# Patient Record
Sex: Female | Born: 1995 | Race: Black or African American | Hispanic: No | Marital: Single | Smoking: Never smoker
Health system: Southern US, Community
[De-identification: ages and names within clinical notes are randomized; demographics above are authoritative.]

## PROBLEM LIST (undated history)

## (undated) DIAGNOSIS — I10 Essential (primary) hypertension: Secondary | ICD-10-CM

## (undated) HISTORY — DX: Essential (primary) hypertension: I10

## (undated) HISTORY — PX: NO PAST SURGERIES: SHX2092

---
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
## 2014-01-18 ENCOUNTER — Encounter (HOSPITAL_COMMUNITY): Payer: Self-pay | Admitting: Emergency Medicine

## 2014-01-18 DIAGNOSIS — IMO0002 Reserved for concepts with insufficient information to code with codable children: Secondary | ICD-10-CM | POA: Insufficient documentation

## 2014-01-18 DIAGNOSIS — Y939 Activity, unspecified: Secondary | ICD-10-CM | POA: Insufficient documentation

## 2014-01-18 DIAGNOSIS — W19XXXA Unspecified fall, initial encounter: Secondary | ICD-10-CM | POA: Insufficient documentation

## 2014-01-18 DIAGNOSIS — Y929 Unspecified place or not applicable: Secondary | ICD-10-CM | POA: Insufficient documentation

## 2014-01-18 NOTE — ED Notes (Signed)
Patient states she fell yesterday injuring her left middle finger.

## 2014-01-19 ENCOUNTER — Emergency Department (HOSPITAL_COMMUNITY)
Admission: EM | Admit: 2014-01-19 | Discharge: 2014-01-19 | Disposition: A | Discharge status: Home / Self Care | Payer: Medicaid Other | Attending: Emergency Medicine | Admitting: Emergency Medicine

## 2014-01-19 ENCOUNTER — Emergency Department (HOSPITAL_COMMUNITY): Payer: Medicaid Other

## 2014-01-19 DIAGNOSIS — S62603A Fracture of unspecified phalanx of left middle finger, initial encounter for closed fracture: Secondary | ICD-10-CM

## 2014-01-19 NOTE — ED Provider Notes (Signed)
Medical screening examination/treatment/procedure(s) were performed by non-physician practitioner and as supervising physician I was immediately available for consultation/collaboration.   Trevel Dillenbeck, MD 01/19/14 0718 

## 2014-01-19 NOTE — ED Notes (Signed)
Patient withcont. pain at this time. Respirations even and unlabored. Skin warm/dry. Discharge instructions reviewed with patient at this time. Patient given opportunity to voice concerns/ask questions.. Patient discharged at this time and left Emergency Department with steady gait.   

## 2014-01-19 NOTE — Discharge Instructions (Signed)
Finger Fracture Fractures of fingers are breaks in the bones of the fingers. There are many types of fractures. There are different ways of treating these fractures. Your health care provider will discuss the best way to treat your fracture. CAUSES Traumatic injury is the main cause of broken fingers. These include:  Injuries while playing sports.  Workplace injuries.  Falls. RISK FACTORS Activities that can increase your risk of finger fractures include:  Sports.  Workplace activities that involve machinery.  A condition called osteoporosis, which can make your bones less dense and cause them to fracture more easily. SIGNS AND SYMPTOMS The main symptoms of a broken finger are pain and swelling within 15 minutes after the injury. Other symptoms include:  Bruising of your finger.  Stiffness of your finger.  Numbness of your finger.  Exposed bones (compound fracture) if the fracture is severe. DIAGNOSIS  The best way to diagnose a broken bone is with X-ray imaging. Additionally, your health care provider will use this X-ray image to evaluate the position of the broken finger bones.  TREATMENT  Finger fractures can be treated with:   Nonreduction This means the bones are in place. The finger is splinted without changing the positions of the bone pieces. The splint is usually left on for about a week to 10 days. This will depend on your fracture and what your health care provider thinks.  Closed reduction The bones are put back into position without using surgery. The finger is then splinted.  Open reduction and internal fixation The fracture site is opened. Then the bone pieces are fixed into place with pins or some type of hardware. This is seldom required. It depends on the severity of the fracture. HOME CARE INSTRUCTIONS   Follow your health care provider's instructions regarding activities, exercises, and physical therapy.  Only take over-the-counter or prescription  medicines for pain, discomfort, or fever as directed by your health care provider. SEEK MEDICAL CARE IF: You have pain or swelling that limits the motion or use of your fingers. SEEK IMMEDIATE MEDICAL CARE IF:  Your finger becomes numb. MAKE SURE YOU:   Understand these instructions.  Will watch your condition.  Will get help right away if you are not doing well or get worse. Document Released: 03/25/2001 Document Revised: 10/01/2013 Document Reviewed: 07/23/2013 [facility] Patient Information 2014 [place], [place].  IBUPROFEN OR TYLENOL FOR PAIN.  WEAR SPLINT AS DIRECTED. FOLLOW-UP WITH THE ORTHOPEDIST.

## 2014-01-19 NOTE — ED Provider Notes (Signed)
CSN: [id number]     Arrival date & time 01/18/14  2303 History   First MD Initiated Contact with Patient 01/19/14 0024     Chief Complaint  Patient presents with  . Finger Injury   (Consider location/radiation/quality/duration/timing/severity/associated sxs/prior Treatment) HPI Comments: Pain and swelling of middle finger left hand.  Patient is right hand dominant.  Patient is a 18 y.o. female presenting with hand pain.  Hand Pain This is a new problem. The current episode started yesterday. The problem has been waxing and waning. Associated symptoms include arthralgias. The symptoms are aggravated by bending. She has tried nothing for the symptoms.    History reviewed. No pertinent past medical history. History reviewed. No pertinent past surgical history. History reviewed. No pertinent family history. History  Substance Use Topics  . Smoking status: Never Smoker   . Smokeless tobacco: Not on file  . Alcohol Use: No   OB History   Grav Para Term Preterm Abortions TAB SAB Ect Mult Living                 Review of Systems  Musculoskeletal: Positive for arthralgias.  All other systems reviewed and are negative.    Allergies  Review of patient's allergies indicates no known allergies.  Home Medications  No current outpatient prescriptions on file. BP 122/77  Pulse 83  Temp(Src) 98.1 F (36.7 C) (Oral)  Resp 24  Ht 5\' 3"  (1.6 m)  Wt 130 lb (58.968 kg)  BMI 23.03 kg/m2  SpO2 100%  LMP 01/14/2014 Physical Exam  Nursing note and vitals reviewed. Constitutional: She is oriented to person, place, and time. She appears well-developed and well-nourished.  HENT:  Head: Normocephalic.  Eyes: Conjunctivae are normal. Pupils are equal, round, and reactive to light.  Neck: Normal range of motion.  Cardiovascular: Normal rate and regular rhythm.   Pulmonary/Chest: Effort normal and breath sounds normal.  Abdominal: Soft. Bowel sounds are normal.  Musculoskeletal: She  exhibits tenderness.  Left middle finger discolored from middle to distal phalanx with mild edema.  Able to flex, limited extension.  Brisk capillary refill, sensation intact.  Lymphadenopathy:    She has no cervical adenopathy.  Neurological: She is alert and oriented to person, place, and time.  Skin: Skin is warm and dry.  Psychiatric: She has a normal mood and affect.    ED Course  Procedures (including critical care time) Labs Review Labs Reviewed - No data to display Imaging Review No results found.  EKG Interpretation   None      Radiology results reviewed and shared with patient. MDM  Fracture of distal aspect of middle phalanx of middle finger left hand.  Static splint, orthopedic follow-up.    Jimmye Normanavid John Tysha Grismore, NP 01/19/14 [phone]

## 2014-06-26 IMAGING — CR DG FINGER MIDDLE 2+V*L*
1 series · 1 of 1 positions shown · non-contrast
Comparison: None.

CLINICAL DATA: Fall, pain, injury

EXAM:
LEFT MIDDLE FINGER 2+V

[view not recorded]
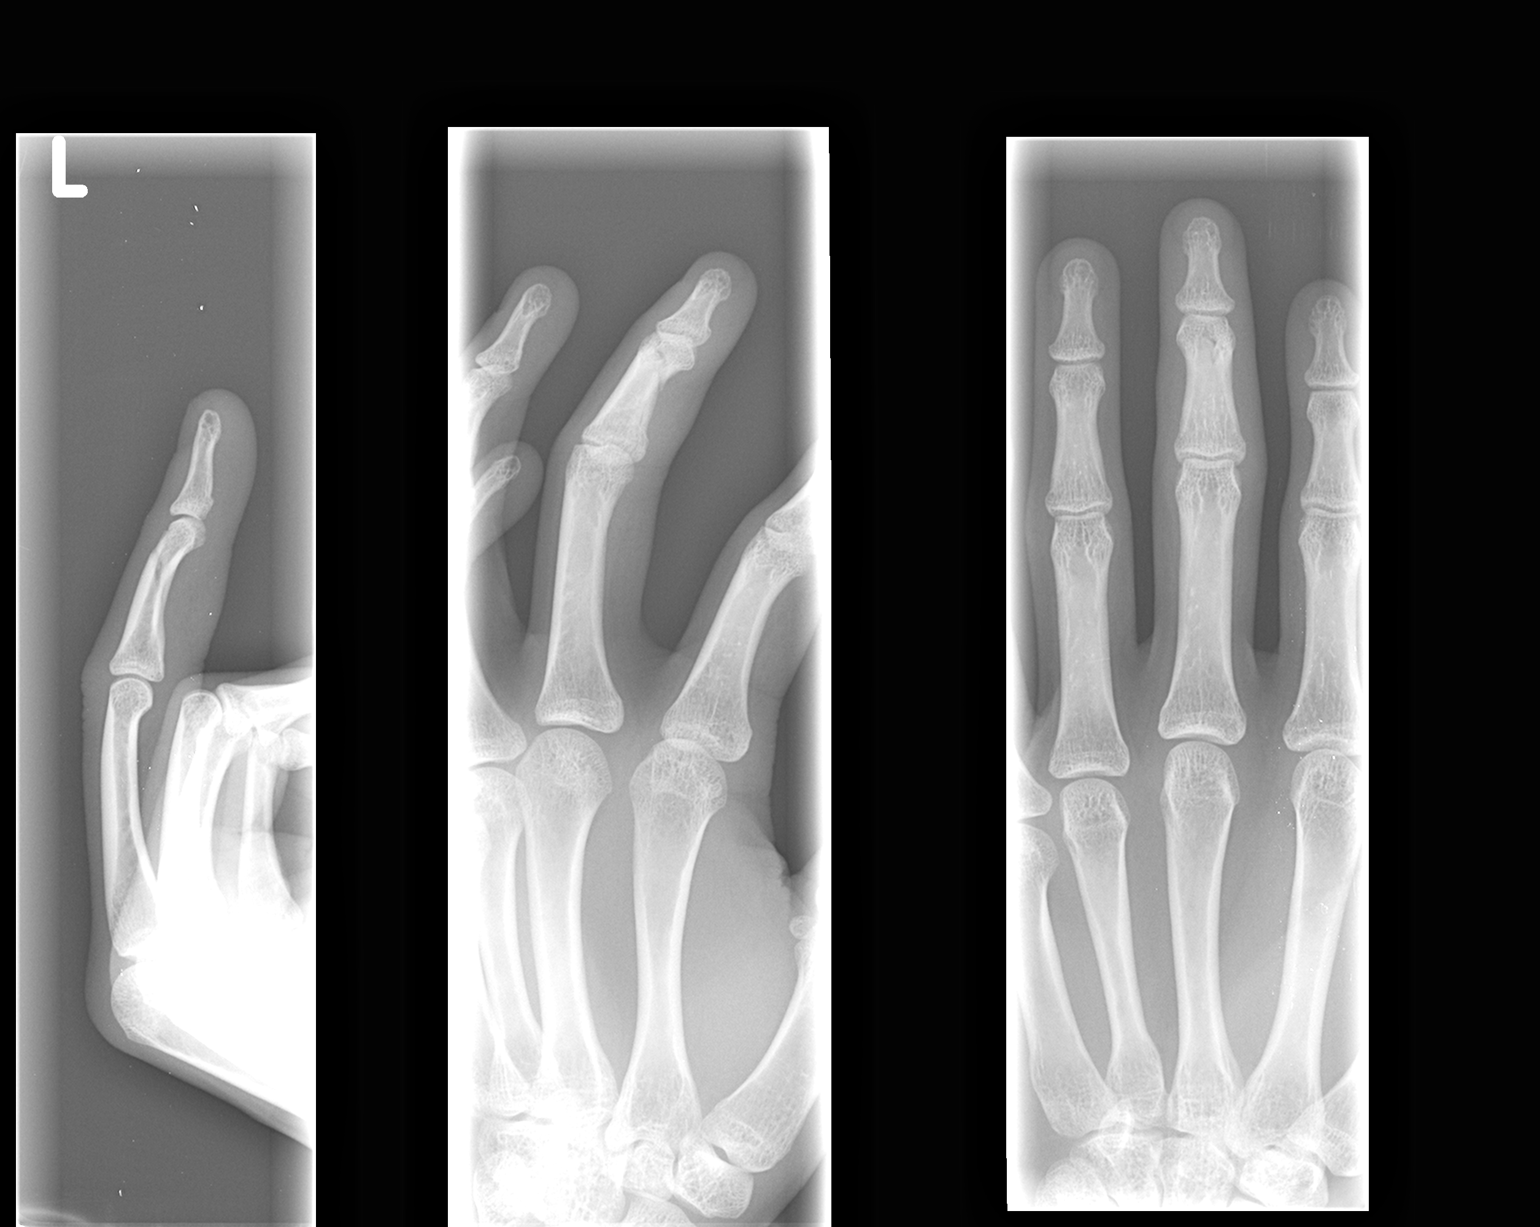

[1 of 1 positions shown; findings below may reference images not displayed]

FINDINGS: There is an acute oblique fracture of the third finger middle
phalanx with slight volar tilt. Mild soft tissue swelling. No other
fractures. Preserved joint spaces. No subluxation or dislocation.
IMPRESSION: Acute fracture left third finger middle phalanx

## 2016-06-01 DIAGNOSIS — O1490 Unspecified pre-eclampsia, unspecified trimester: Secondary | ICD-10-CM

## 2018-07-25 ENCOUNTER — Encounter (HOSPITAL_COMMUNITY): Payer: Self-pay | Admitting: Emergency Medicine

## 2018-07-25 ENCOUNTER — Other Ambulatory Visit: Payer: Self-pay

## 2018-07-25 ENCOUNTER — Emergency Department (HOSPITAL_COMMUNITY)
Admission: EM | Admit: 2018-07-25 | Discharge: 2018-07-25 | Disposition: A | Discharge status: Home / Self Care | Payer: Self-pay | Attending: Emergency Medicine | Admitting: Emergency Medicine

## 2018-07-25 DIAGNOSIS — J029 Acute pharyngitis, unspecified: Secondary | ICD-10-CM

## 2018-07-25 DIAGNOSIS — Y828 Other medical devices associated with adverse incidents: Secondary | ICD-10-CM | POA: Insufficient documentation

## 2018-07-25 DIAGNOSIS — Z79899 Other long term (current) drug therapy: Secondary | ICD-10-CM | POA: Insufficient documentation

## 2018-07-25 DIAGNOSIS — T8332XA Displacement of intrauterine contraceptive device, initial encounter: Secondary | ICD-10-CM

## 2018-07-25 DIAGNOSIS — N939 Abnormal uterine and vaginal bleeding, unspecified: Secondary | ICD-10-CM

## 2018-07-25 DIAGNOSIS — A599 Trichomoniasis, unspecified: Secondary | ICD-10-CM

## 2018-07-25 LAB — BASIC METABOLIC PANEL
ANION GAP: 8 (ref 5–15)
BUN: 11 mg/dL (ref 6–20)
CHLORIDE: 103 mmol/L (ref 98–111)
CO2: 24 mmol/L (ref 22–32)
Calcium: 8.5 mg/dL — ABNORMAL LOW (ref 8.9–10.3)
Creatinine, Ser: 1.04 mg/dL — ABNORMAL HIGH (ref 0.44–1.00)
GFR calc Af Amer: >60 mL/min (ref >60)
GLUCOSE: 106 mg/dL — AB (ref 70–99)
POTASSIUM: 3.2 mmol/L — AB (ref 3.5–5.1)
Sodium: 135 mmol/L (ref 135–145)

## 2018-07-25 LAB — WET PREP, GENITAL
CLUE CELLS WET PREP: NONE SEEN
SPERM: NONE SEEN

## 2018-07-25 LAB — CBC WITH DIFFERENTIAL/PLATELET
Basophils Absolute: 0 10*3/uL (ref 0.0–0.1)
Basophils Relative: 0 %
EOS PCT: 0 %
Eosinophils Absolute: 0 10*3/uL (ref 0.0–0.7)
HEMATOCRIT: 40.7 % (ref 36.0–46.0)
Hemoglobin: 13.9 g/dL (ref 12.0–15.0)
LYMPHS PCT: 14 %
Lymphs Abs: 1.5 10*3/uL (ref 0.7–4.0)
MCH: 29.5 pg (ref 26.0–34.0)
MCHC: 34.2 g/dL (ref 30.0–36.0)
MCV: 86.4 fL (ref 78.0–100.0)
Monocytes Absolute: 1.2 10*3/uL — ABNORMAL HIGH (ref 0.1–1.0)
Monocytes Relative: 11 %
NEUTROS ABS: 8.2 10*3/uL — AB (ref 1.7–7.7)
Neutrophils Relative %: 75 %
PLATELETS: 194 10*3/uL (ref 150–400)
RBC: 4.71 MIL/uL (ref 3.87–5.11)
RDW: 13.6 % (ref 11.5–15.5)
WBC: 10.9 10*3/uL — AB (ref 4.0–10.5)

## 2018-07-25 LAB — GROUP A STREP BY PCR: Group A Strep by PCR: NOT DETECTED

## 2018-07-25 LAB — PREGNANCY, URINE: Preg Test, Ur: NEGATIVE

## 2018-07-25 MED ORDER — METRONIDAZOLE 500 MG PO TABS: 500.0000 mg | ORAL_TABLET | Freq: Two times a day (BID) | ORAL | 0 refills | Status: DC

## 2018-07-25 MED ORDER — ACETAMINOPHEN 325 MG PO TABS
650.0000 mg | ORAL_TABLET | Freq: Once | ORAL | Status: AC
  Administered 2018-07-25: 650 mg via ORAL
  Filled 2018-07-25: qty 2

## 2018-07-25 MED ORDER — LIDOCAINE HCL (PF) 1 % IJ SOLN
INTRAMUSCULAR | Status: AC
  Administered 2018-07-25: 21:00:00
  Filled 2018-07-25: qty 5

## 2018-07-25 MED ORDER — AZITHROMYCIN 250 MG PO TABS
1000.0000 mg | ORAL_TABLET | Freq: Once | ORAL | Status: AC
  Administered 2018-07-25: 1000 mg via ORAL
  Filled 2018-07-25: qty 4

## 2018-07-25 MED ORDER — FLUCONAZOLE 100 MG PO TABS
100.0000 mg | ORAL_TABLET | Freq: Once | ORAL | Status: AC
  Administered 2018-07-25: 100 mg via ORAL
  Filled 2018-07-25: qty 1

## 2018-07-25 MED ORDER — CEFTRIAXONE SODIUM 250 MG IJ SOLR
250.0000 mg | Freq: Once | INTRAMUSCULAR | Status: AC
  Administered 2018-07-25: 250 mg via INTRAMUSCULAR
  Filled 2018-07-25: qty 250

## 2018-07-25 MED ORDER — NORGESTIMATE-ETH ESTRADIOL 0.25-35 MG-MCG PO TABS: 1.0000 | ORAL_TABLET | Freq: Every day | ORAL | 0 refills | Status: DC

## 2018-07-25 NOTE — ED Triage Notes (Signed)
Pt received Mirana IUD 7 weeks ago and c/o vag bleeding since. Did let OB know. Pt c/o sore throat, not eating, fever and gen weakenss x 2 days.

## 2018-07-25 NOTE — Discharge Instructions (Signed)
We have removed your IUD. You will need to start the birth control pills and follow up with your GYN to discuss further birth control. We are treating you for trichomonas. Do not drink alcohol while taking the medication because it will make you sick if you do. Be sure your sex partner is treated for trichomonas before you have sex again.

## 2018-07-25 NOTE — ED Provider Notes (Signed)
[facility] EMERGENCY DEPARTMENT Provider Note   CSN: [id number] Arrival date & time: 07/25/18  1632     History   Chief Complaint Chief Complaint  Patient presents with  . Vaginal Bleeding    HPI Rhonda Leach is a 22 y.o. female who presents to the ED with c/o vaginal bleeding. Patient reports having IUD placed 2 years ago and has had bleeding off and on.  Patient has discussed this with her OB. Patient also c/o sore throat, fever and feeling tired x 2 days.   HPI  History reviewed. No pertinent past medical history.  There are no active problems to display for this patient.   History reviewed. No pertinent surgical history.   OB History   None      Home Medications    Prior to Admission medications   Medication Sig Start Date End Date Taking? Authorizing Provider  metroNIDAZOLE (FLAGYL) 500 MG tablet Take 1 tablet (500 mg total) by mouth 2 (two) times daily. 07/25/18   Janne NapoleonNeese, Hope M, NP  norgestimate-ethinyl estradiol (SPRINTEC 28) 0.25-35 MG-MCG tablet Take 1 tablet by mouth daily. 07/25/18   Janne NapoleonNeese, Hope M, NP    Family History History reviewed. No pertinent family history.  Social History Social History   Tobacco Use  . Smoking status: Never Smoker  . Smokeless tobacco: Never Used  Substance Use Topics  . Alcohol use: No  . Drug use: No     Allergies   Patient has no known allergies.   Review of Systems Review of Systems  Constitutional: Positive for chills and fever.  HENT: Positive for sore throat.   Eyes: Negative for visual disturbance.  Respiratory: Negative for cough and shortness of breath.   Cardiovascular: Negative for chest pain.  Gastrointestinal: Negative for nausea and vomiting.  Genitourinary: Positive for vaginal bleeding.  Musculoskeletal: Negative for neck pain and neck stiffness.  Skin: Negative for rash.  Neurological: Positive for headaches. Negative for syncope.  Psychiatric/Behavioral: Negative for confusion.      Physical Exam Updated Vital Signs BP (!) 127/92 (BP Location: Right Arm)   Pulse 86   Temp 98.7 F (37.1 C) (Oral)   Resp 20   LMP 07/25/2018   SpO2 100%   Physical Exam  Constitutional: She appears well-developed and well-nourished. No distress.  Eyes: EOM are normal.  Neck: Neck supple.  Cardiovascular: Tachycardia present.  Pulmonary/Chest: Effort normal.  Abdominal: Soft. There is no tenderness.  Genitourinary:  Genitourinary Comments: External genitalia without lesions, small blood vaginal vault. Arm of IUD visualized outside the cervix, cervix inflamed. No CMT, no adnexal tenderness. Uterus without enlargement.   Musculoskeletal: Normal range of motion.  Neurological: She is alert.  Skin: Skin is warm and dry.  Psychiatric: She has a normal mood and affect.  Nursing note and vitals reviewed.    ED Treatments / Results  Labs (all labs ordered are listed, but only abnormal results are displayed) Labs Reviewed  WET PREP, GENITAL - Abnormal; Notable for the following components:      Result Value   Yeast Wet Prep HPF POC PRESENT (*)    Trich, Wet Prep PRESENT (*)    WBC, Wet Prep HPF POC MANY (*)    All other components within normal limits  CBC WITH DIFFERENTIAL/PLATELET - Abnormal; Notable for the following components:   WBC 10.9 (*)    Neutro Abs 8.2 (*)    Monocytes Absolute 1.2 (*)    All other components within normal limits  BASIC METABOLIC PANEL - Abnormal; Notable for the following components:   Potassium 3.2 (*)    Glucose, Bld 106 (*)    Creatinine, Ser 1.04 (*)    Calcium 8.5 (*)    All other components within normal limits  GROUP A STREP BY PCR  PREGNANCY, URINE  GC/CHLAMYDIA PROBE AMP ([place]) NOT AT [facility]   Radiology No results found.  Procedures .Foreign Body Removal Date/Time: 07/25/2018 8:44 PM Performed by: Janne Napoleon, NP Authorized by: Janne Napoleon, NP  Consent: Verbal consent obtained. Risks and benefits: risks,  benefits and alternatives were discussed Consent given by: patient Patient understanding: patient states understanding of the procedure being performed Required items: required blood products, implants, devices, and special equipment available Patient identity confirmed: verbally with patient Body area: vagina  Sedation: Patient sedated: no  Patient restrained: no Patient cooperative: yes Localization method: speculum Removal mechanism: forceps 1 objects recovered. Objects recovered: IUD Patient tolerance: Patient tolerated the procedure well with no immediate complications Comments: Patient with IUD partially expelled from cervix. Successfully removed the IUD.    (including critical care time)  Medications Ordered in ED Medications  acetaminophen (TYLENOL) tablet 650 mg (650 mg Oral Given 07/25/18 1730)  cefTRIAXone (ROCEPHIN) injection 250 mg (250 mg Intramuscular Given 07/25/18 1927)  azithromycin (ZITHROMAX) tablet 1,000 mg (1,000 mg Oral Given 07/25/18 1926)  lidocaine (PF) (XYLOCAINE) 1 % injection (  Given 07/25/18 2032)  fluconazole (DIFLUCAN) tablet 100 mg (100 mg Oral Given 07/25/18 2047)     Initial Impression / Assessment and Plan / ED Course  I have reviewed the triage vital signs and the nursing notes. Pt febrile without tonsillar exudate, negative strep. Presents with mild cervical lymphadenopathy, & dysphagia; diagnosis of viral pharyngitis.n  Pt does not appear dehydrated, but did discuss importance of water rehydration. Presentation non concerning for PTA or RPA. No trismus or uvula deviation. Specific return precautions discussed. Pt able to drink water in ED without difficulty with intact air way. Recommended PCP follow up. Patient also with abnormal vaginal bleeding and IUD that was partially expelled. Discussed with the patient after IUD removal need for f/u. Will start OC's tonight and will treat for positive trichomonas and yeast. Patient also covered for GC and  Chlamydia. Cultures pending.  Discussed no alcohol while taking the Flagyl. Patient agrees with plan.   Final Clinical Impressions(s) / ED Diagnoses   Final diagnoses:  Trichomonas infection  Abnormal vaginal bleeding  Displacement of intrauterine contraceptive device, initial encounter  Viral pharyngitis    ED Discharge Orders        Ordered    metroNIDAZOLE (FLAGYL) 500 MG tablet  2 times daily     07/25/18 2037    norgestimate-ethinyl estradiol (SPRINTEC 28) 0.25-35 MG-MCG tablet  Daily     07/25/18 2037       Kerrie [place], NP 07/25/18 2050    Dione Booze, MD 07/26/18 [phone]

## 2018-07-26 LAB — GC/CHLAMYDIA PROBE AMP (~~LOC~~) NOT AT ARMC
CHLAMYDIA, DNA PROBE: NEGATIVE
Neisseria Gonorrhea: NEGATIVE

## 2019-05-02 DIAGNOSIS — O1400 Mild to moderate pre-eclampsia, unspecified trimester: Secondary | ICD-10-CM

## 2021-03-14 ENCOUNTER — Other Ambulatory Visit: Payer: Self-pay | Admitting: Obstetrics & Gynecology

## 2021-03-14 DIAGNOSIS — Z363 Encounter for antenatal screening for malformations: Secondary | ICD-10-CM

## 2021-03-15 ENCOUNTER — Other Ambulatory Visit: Payer: Self-pay

## 2021-03-15 ENCOUNTER — Ambulatory Visit (INDEPENDENT_AMBULATORY_CARE_PROVIDER_SITE_OTHER): Payer: Medicaid Other

## 2021-03-15 DIAGNOSIS — Z363 Encounter for antenatal screening for malformations: Secondary | ICD-10-CM

## 2021-03-15 DIAGNOSIS — Z3A21 21 weeks gestation of pregnancy: Secondary | ICD-10-CM | POA: Diagnosis not present

## 2021-03-15 NOTE — Progress Notes (Addendum)
[place] 21+4 wks,cephalic,cx 3 cm,posterior placenta gr 0,fhr 154 bpm,AFI 7.4 cm,small stomach bubble,EFW 492 g 79%,limited view because of decreased AFI,limited view of CI and NB

## 2021-03-17 ENCOUNTER — Encounter: Payer: Self-pay | Admitting: Advanced Practice Midwife

## 2021-03-17 ENCOUNTER — Other Ambulatory Visit: Payer: Self-pay

## 2021-03-17 ENCOUNTER — Ambulatory Visit: Payer: Medicaid Other | Admitting: *Deleted

## 2021-03-17 ENCOUNTER — Ambulatory Visit (INDEPENDENT_AMBULATORY_CARE_PROVIDER_SITE_OTHER): Payer: Medicaid Other | Admitting: Advanced Practice Midwife

## 2021-03-17 VITALS — BP 120/80 | HR 101 | Wt 135.0 lb

## 2021-03-17 DIAGNOSIS — O09299 Supervision of pregnancy with other poor reproductive or obstetric history, unspecified trimester: Secondary | ICD-10-CM | POA: Diagnosis not present

## 2021-03-17 DIAGNOSIS — Z3480 Encounter for supervision of other normal pregnancy, unspecified trimester: Secondary | ICD-10-CM | POA: Diagnosis not present

## 2021-03-17 DIAGNOSIS — O4102X0 Oligohydramnios, second trimester, not applicable or unspecified: Secondary | ICD-10-CM

## 2021-03-17 DIAGNOSIS — Z3A21 21 weeks gestation of pregnancy: Secondary | ICD-10-CM

## 2021-03-17 DIAGNOSIS — O42919 Preterm premature rupture of membranes, unspecified as to length of time between rupture and onset of labor, unspecified trimester: Secondary | ICD-10-CM

## 2021-03-17 DIAGNOSIS — Z3490 Encounter for supervision of normal pregnancy, unspecified, unspecified trimester: Secondary | ICD-10-CM | POA: Insufficient documentation

## 2021-03-17 LAB — POCT URINALYSIS DIPSTICK OB
Glucose, UA: NEGATIVE
Ketones, UA: NEGATIVE
Leukocytes, UA: NEGATIVE
Nitrite, UA: NEGATIVE

## 2021-03-17 MED ORDER — ASPIRIN EC 81 MG PO TBEC: 162.0000 mg | DELAYED_RELEASE_TABLET | Freq: Every day | ORAL | 6 refills | Status: AC

## 2021-03-17 NOTE — Progress Notes (Signed)
INITIAL OBSTETRICAL VISIT Patient name: Rhonda Leach MRN [id number]  Date of birth: 30-Oct-1996 Chief Complaint:   Initial Prenatal Visit (Blood in urine, "leaking fluid")  History of Present Illness:   Rhonda Leach is a 25 y.o. [id number]  female at [redacted]w[redacted]d by LMP c/w u/s at 21.4 weeks with an Estimated Date of Delivery: 07/22/21 being seen today for her initial obstetrical visit.   Her obstetrical history is significant for PreE X2; PTB at 36.6 weeks.   Today she reports VB and 'leaking"  . Went to [facility] for the same yesterday and a fern was neg, no cultures.  Was prescribed atenolol in July by [facility], unsure why. In the exam room, pt began to have increase leaking of fluid and blood. No pain/cramps. .  Depression screen [facility] 2/9 03/17/2021  Decreased Interest 1  Down, Depressed, Hopeless 0  PHQ - 2 Score 1  Altered sleeping 0  Tired, decreased energy 1  Change in appetite 0  Feeling bad or failure about yourself  0  Trouble concentrating 0  Moving slowly or fidgety/restless 0  Suicidal thoughts 0  PHQ-9 Score 2    Patient's last menstrual period was 10/15/2020.  Review of Systems:   Pertinent items are noted in HPI Denies cramping/contractions, headaches, visual changes, shortness of breath, chest pain, abdominal pain, severe nausea/vomiting, or problems with urination or bowel movements unless otherwise stated above.  Pertinent History Reviewed:  Reviewed past medical,surgical, social, obstetrical and family history.  Reviewed problem list, medications and allergies. OB History  Gravida Para Term Preterm AB Living  3 2 1 1   2   SAB IAB Ectopic Multiple Live Births          2    # Outcome Date GA Lbr Len/2nd Weight Sex Delivery Anes PTL Lv  3 Current           2 Preterm 05/02/19 [redacted]w[redacted]d  7 lb 6 oz (3.345 kg) M Vag-Spont Other Y LIV     Complications: Mild pre-eclampsia  1 Term 06/01/16 [redacted]w[redacted]d  6 lb 9 oz (2.977 kg) M Vag-Spont Other N LIV     Complications:  Pre-eclampsia   Physical Assessment:   Vitals:   03/17/21 1352  BP: 120/80  Pulse: (!) 101  Weight: 135 lb (61.2 kg)  Body mass index is 23.91 kg/m.       Physical Examination:  General appearance - well appearing, and in no distress  Mental status - alert, oriented to person, place, and time  Psych:  She has a normal mood and affect  Skin - warm and dry, normal color, no suspicious lesions noted  Chest - effort normal  Heart - normal rate and regular rhythm  Abdomen - soft, nontender  Extremities:  No swelling or varicosities noted  Pelvic -SSE:  Copious amount of blood fluid running out of vagina.  Dr. 03/19/21 in for co exam.  Vault evacuated of a 5cm blood clot, Cx appears thick/multiparous.  Bedside Despina Hidden reveals anhydramnios and anterior collection of what appears to be blood between membranes and uterus, separate from posterior placenta Results for orders placed or performed in visit on 03/17/21 (from the past 24 hour(s))  POC Urinalysis Dipstick OB   Collection Time: 03/17/21  2:19 PM  Result Value Ref Range   Color, UA     Clarity, UA     Glucose, UA Negative Negative   Bilirubin, UA     Ketones, UA neg    Spec Grav,  UA     Blood, UA large    pH, UA     POC,PROTEIN,UA Moderate (2+) Negative, Trace, Small (1+), Moderate (2+), Large (3+), 4+   Urobilinogen, UA     Nitrite, UA neg    Leukocytes, UA Negative Negative   Appearance     Odor      Assessment & Plan:  1) High-Risk Pregnancy [id number] at [redacted]w[redacted]d with an Estimated Date of Delivery: 07/22/21   2) Initial OB visit  3) PPROM:   Pt verbalized understanding that if she delivers before 23 weeks, her baby will most likely not survive.  Discussed possibility of hypoplastic lungs if delivered >23 weeks.  Recommended consult w/MFM tomorrow to allow pt to digest this information and to discuss options.  Agreed to consult. Discussed s/s of infection/labor.  While she was getting dressed, I went to my office and made an urgent  appt w/MFM for consult/US/options.  When I went back into the room, the pt and all of her belongings were gone  She did not answer a phone call and her voice mailbox was full. Appt stands for tomorrow am at 0730.  Pt has not activated mychart.   Meds: No orders of the defined types were placed in this encounter.

## 2021-03-17 NOTE — Patient Instructions (Signed)
Appointment with Maternal Fetal Medicine tomorrow at 7:30 am for ultrasound.  [address] [place], [place]

## 2021-03-18 ENCOUNTER — Other Ambulatory Visit: Payer: Self-pay | Admitting: Advanced Practice Midwife

## 2021-03-18 ENCOUNTER — Other Ambulatory Visit: Payer: Medicaid Other

## 2021-03-18 ENCOUNTER — Ambulatory Visit: Payer: Medicaid Other | Attending: General Practice

## 2021-03-18 DIAGNOSIS — Z363 Encounter for antenatal screening for malformations: Secondary | ICD-10-CM
